# Patient Record
Sex: Female | Born: 1950 | Race: White | Hispanic: No | Marital: Married | Smoking: Never smoker
Health system: Southern US, Community
[De-identification: ages and names within clinical notes are randomized; demographics above are authoritative.]

## PROBLEM LIST (undated history)

## (undated) HISTORY — PX: LAPAROSCOPIC HYSTERECTOMY: SHX1926

---

## 1998-03-03 ENCOUNTER — Ambulatory Visit (HOSPITAL_COMMUNITY): Admission: RE | Admit: 1998-03-03 | Discharge: 1998-03-03 | Payer: Self-pay | Admitting: Emergency Medicine

## 1998-03-08 ENCOUNTER — Emergency Department (HOSPITAL_COMMUNITY): Admission: EM | Admit: 1998-03-08 | Discharge: 1998-03-08 | Payer: Self-pay | Admitting: Emergency Medicine

## 1998-03-10 ENCOUNTER — Emergency Department (HOSPITAL_COMMUNITY): Admission: EM | Admit: 1998-03-10 | Discharge: 1998-03-10 | Payer: Self-pay | Admitting: Emergency Medicine

## 1999-10-25 ENCOUNTER — Encounter: Payer: Self-pay | Admitting: Orthopedic Surgery

## 1999-10-25 ENCOUNTER — Ambulatory Visit (HOSPITAL_COMMUNITY): Admission: RE | Admit: 1999-10-25 | Discharge: 1999-10-25 | Payer: Self-pay | Admitting: Orthopedic Surgery

## 2000-10-29 ENCOUNTER — Emergency Department (HOSPITAL_COMMUNITY): Admission: EM | Admit: 2000-10-29 | Discharge: 2000-10-30 | Payer: Self-pay | Admitting: Emergency Medicine

## 2001-03-12 ENCOUNTER — Ambulatory Visit (HOSPITAL_COMMUNITY): Admission: RE | Admit: 2001-03-12 | Discharge: 2001-03-12 | Payer: Self-pay | Admitting: Emergency Medicine

## 2001-04-21 ENCOUNTER — Emergency Department (HOSPITAL_COMMUNITY): Admission: EM | Admit: 2001-04-21 | Discharge: 2001-04-22 | Payer: Self-pay | Admitting: Emergency Medicine

## 2001-04-22 ENCOUNTER — Encounter: Payer: Self-pay | Admitting: Emergency Medicine

## 2002-08-27 ENCOUNTER — Ambulatory Visit (HOSPITAL_COMMUNITY): Admission: RE | Admit: 2002-08-27 | Discharge: 2002-08-27 | Payer: Self-pay | Admitting: Gastroenterology

## 2002-08-27 ENCOUNTER — Encounter (INDEPENDENT_AMBULATORY_CARE_PROVIDER_SITE_OTHER): Payer: Self-pay | Admitting: Specialist

## 2002-08-28 ENCOUNTER — Ambulatory Visit (HOSPITAL_COMMUNITY): Admission: RE | Admit: 2002-08-28 | Discharge: 2002-08-28 | Payer: Self-pay | Admitting: Family Medicine

## 2002-08-28 ENCOUNTER — Encounter: Payer: Self-pay | Admitting: Family Medicine

## 2002-12-16 ENCOUNTER — Emergency Department (HOSPITAL_COMMUNITY): Admission: EM | Admit: 2002-12-16 | Discharge: 2002-12-16 | Payer: Self-pay | Admitting: Emergency Medicine

## 2003-03-31 ENCOUNTER — Encounter: Payer: Self-pay | Admitting: Obstetrics and Gynecology

## 2003-03-31 ENCOUNTER — Encounter: Admission: RE | Admit: 2003-03-31 | Discharge: 2003-03-31 | Payer: Self-pay | Admitting: Obstetrics and Gynecology

## 2003-09-20 ENCOUNTER — Emergency Department (HOSPITAL_COMMUNITY): Admission: EM | Admit: 2003-09-20 | Discharge: 2003-09-20 | Payer: Self-pay | Admitting: Emergency Medicine

## 2004-02-19 ENCOUNTER — Encounter (INDEPENDENT_AMBULATORY_CARE_PROVIDER_SITE_OTHER): Payer: Self-pay | Admitting: Specialist

## 2004-02-19 ENCOUNTER — Observation Stay (HOSPITAL_COMMUNITY): Admission: RE | Admit: 2004-02-19 | Discharge: 2004-02-20 | Payer: Self-pay | Admitting: Surgery

## 2004-11-04 ENCOUNTER — Other Ambulatory Visit: Admission: RE | Admit: 2004-11-04 | Discharge: 2004-11-04 | Payer: Self-pay | Admitting: Family Medicine

## 2004-11-08 ENCOUNTER — Ambulatory Visit (HOSPITAL_COMMUNITY): Admission: RE | Admit: 2004-11-08 | Discharge: 2004-11-08 | Payer: Self-pay | Admitting: Family Medicine

## 2004-12-23 ENCOUNTER — Ambulatory Visit (HOSPITAL_COMMUNITY): Admission: RE | Admit: 2004-12-23 | Discharge: 2004-12-23 | Payer: Self-pay | Admitting: Obstetrics and Gynecology

## 2005-05-05 IMAGING — US US PELVIS COMPLETE MODIFY
1 series · 14 of 25 positions shown · non-contrast
Comparison: none

CLINICAL DATA: Dysfunctional uterine bleeding.
 PELVIC ULTRASOUND ENDOVAGINAL AND TRANSABDOMINAL ? 11/08/04 AT 2356 HOURS:

[Series 1: unknown · 0.29mm/px · 14 of 59 slices shown]
[im 1/59]
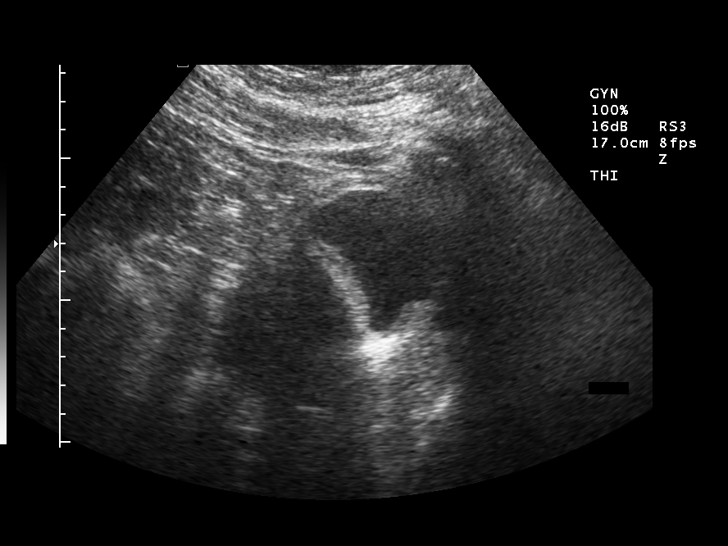
[im 5/59]
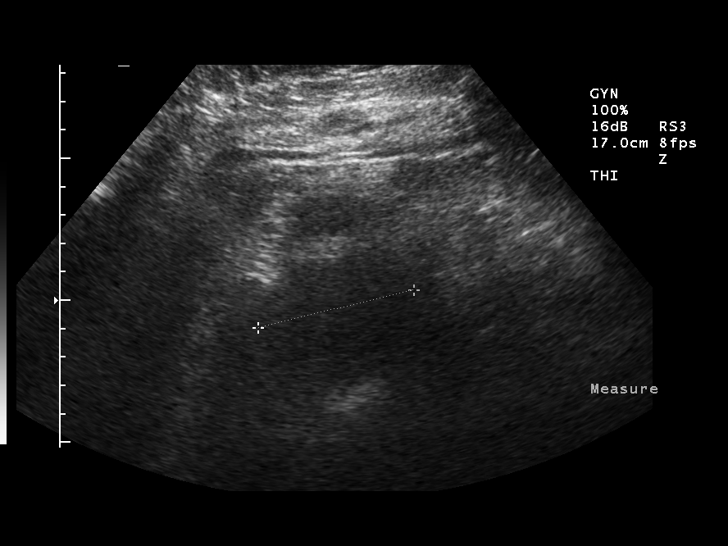
[im 10/59]
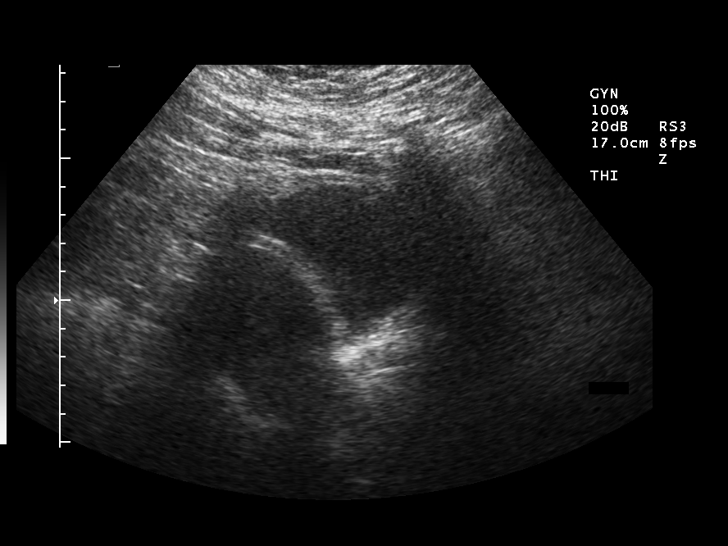
[im 15/59]
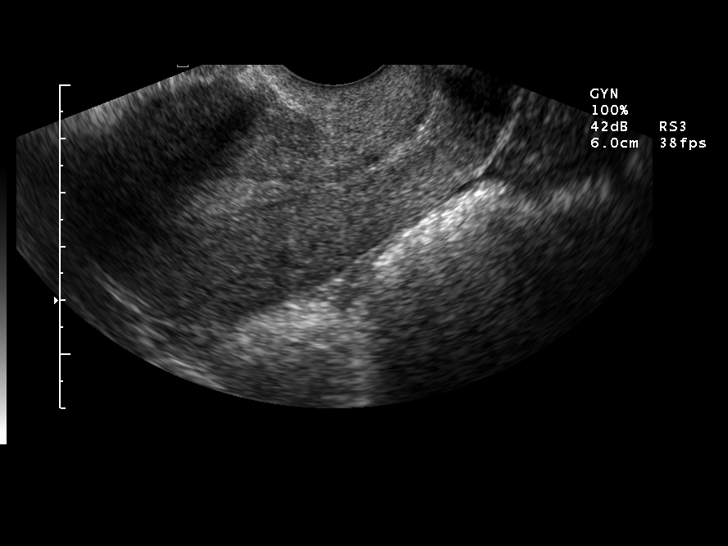
[im 20/59]
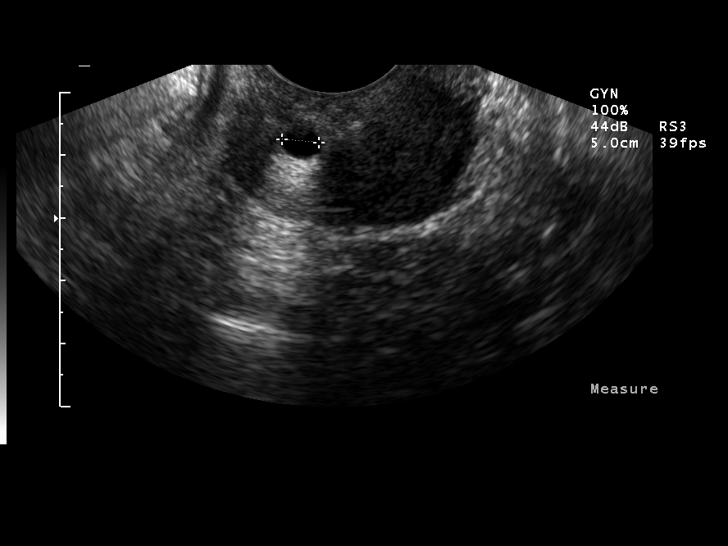
[im 22/59]
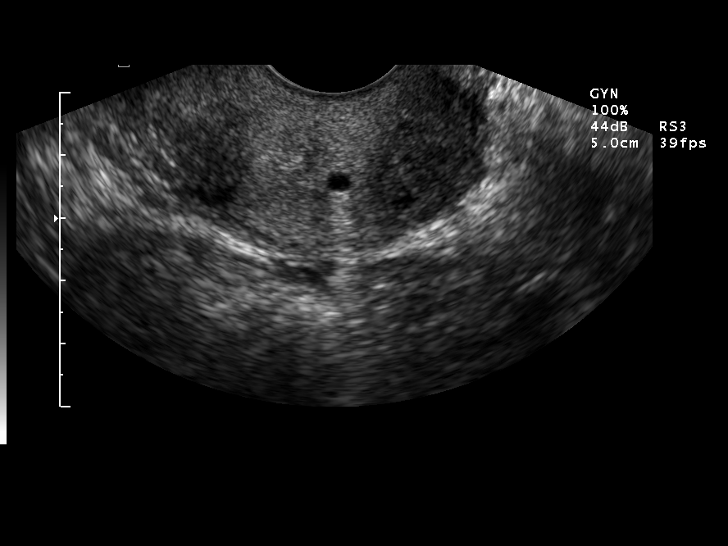
[im 27/59]
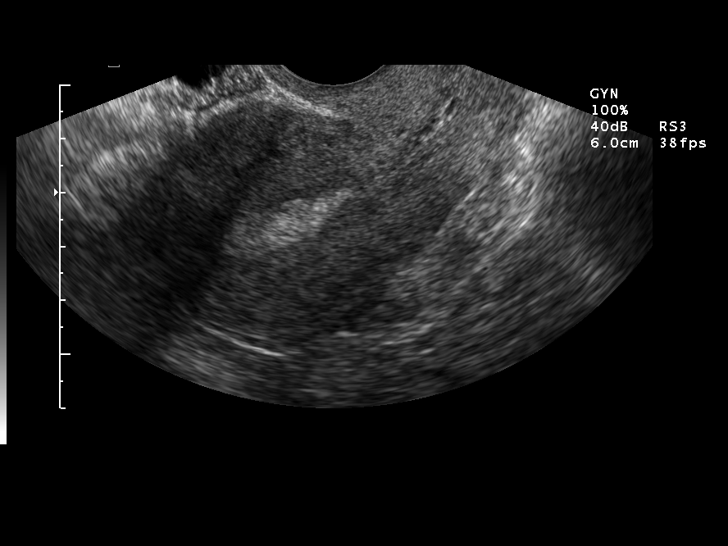
[im 32/59]
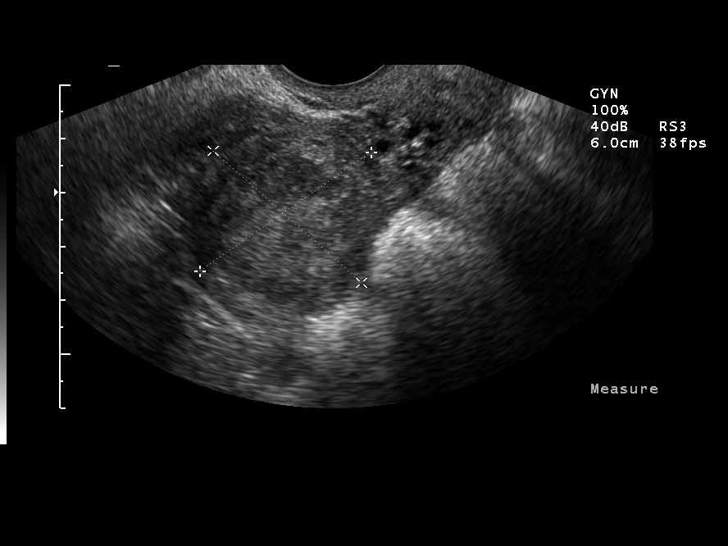
[im 37/59]
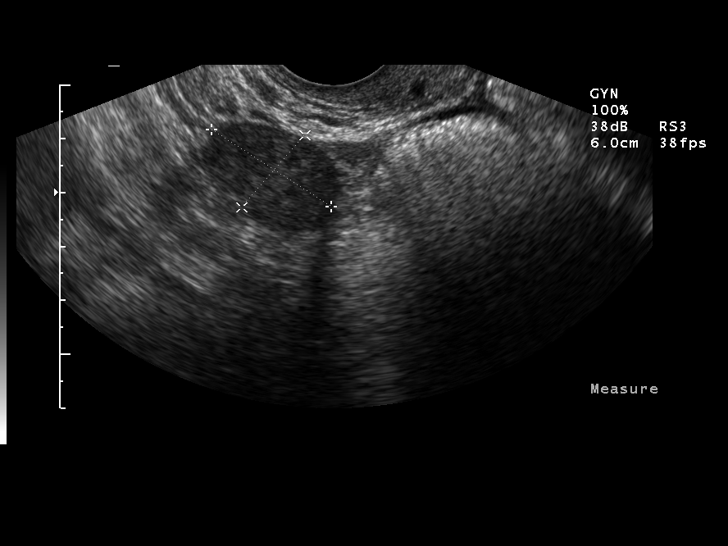
[im 39/59]
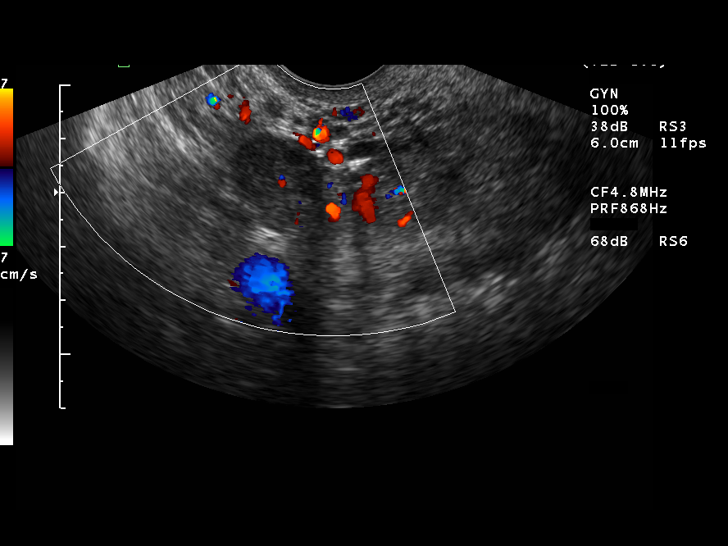
[im 44/59]
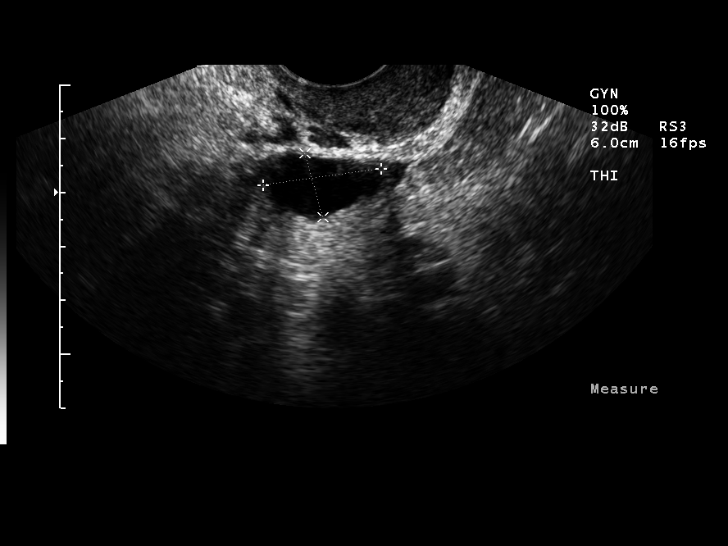
[im 49/59]
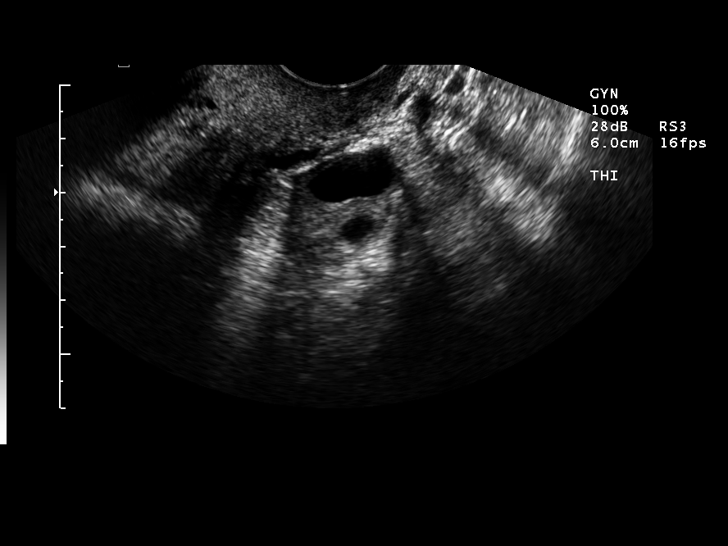
[im 54/59]
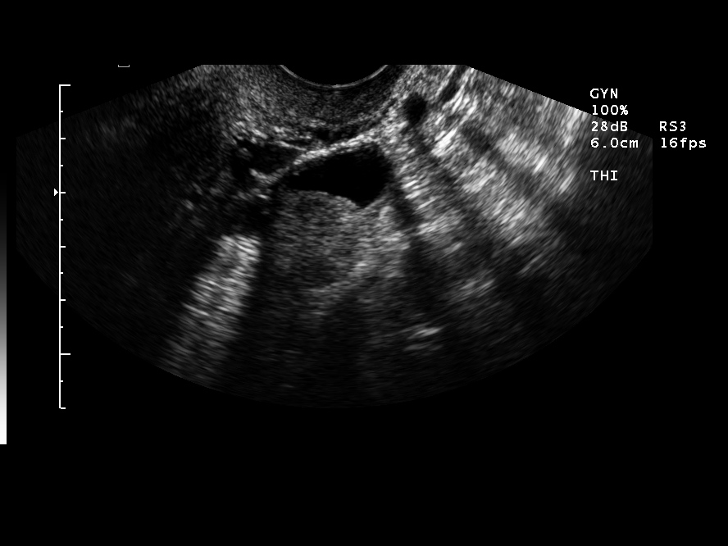
[im 59/59]
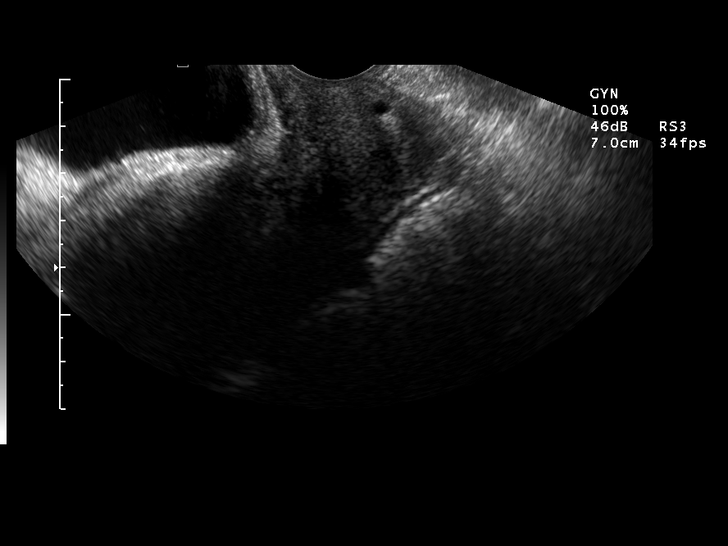

[14 of 25 positions shown; findings below may reference images not displayed]

FINDINGS: The endometrial stripe is uniform and is 9 mm in thickness.  A 3.8 x 3.6 x 3.9 cm fundal uterine fibroid is noted at the right side.  The right ovary is within normal limits.  There is a 2.2 x 1.2 x 1.9 cm complex cyst in the left ovary.  Negative free fluid.
IMPRESSION: 1.  Single large uterine fibroid.
 2.  Complex cyst in the left ovary.  Follow-up in six weeks is recommended to ensure resolution.

## 2005-06-19 IMAGING — US US PELVIS COMPLETE MODIFY
1 series · 14 of 25 positions shown · non-contrast
Comparison: none

CLINICAL DATA: The patient has a previous history of a complex left ovarian cyst. 
 ULTRASOUND OF THE PELVIS:
 Transabdominal study reveals the uterus to measure 8.8 x 4.2 x 4.6 cm.  A 2.9 x 3.2 x 2.8 cm fibroid is again noted in the right portion of the uterus.
 Transvaginal study reveals the endometrium again to be prominent measuring up to 9 mm.  
 The right ovary measures 4.4 x 2.8 x 4.0 cm with a 2.6 x 2.3 x 2.7 cm cyst in the right ovary.  There is noted to be a smaller cyst within this larger cyst. 
 The left ovary measures 3.2 x 1.6 x 2.1 cm. The previously noted 2.2 cm cyst of the left ovary is now smaller measuring 1.1 x .6 x 1.3 cm.  No free fluid.  Uterine fibroids are again noted.  There has been interval decrease in the previously noted 2.2 cm left ovarian cyst. The cyst is again irregular in contour but is smaller in size.   A new finding is a 2.7 cm cyst of the right ovary with a smaller internal cyst. 
 Follow-up ultrasound would be suggested in 2-3 months.

[Series 1: unknown · 0.31mm/px · 14 of 59 slices shown]
[im 1/59]
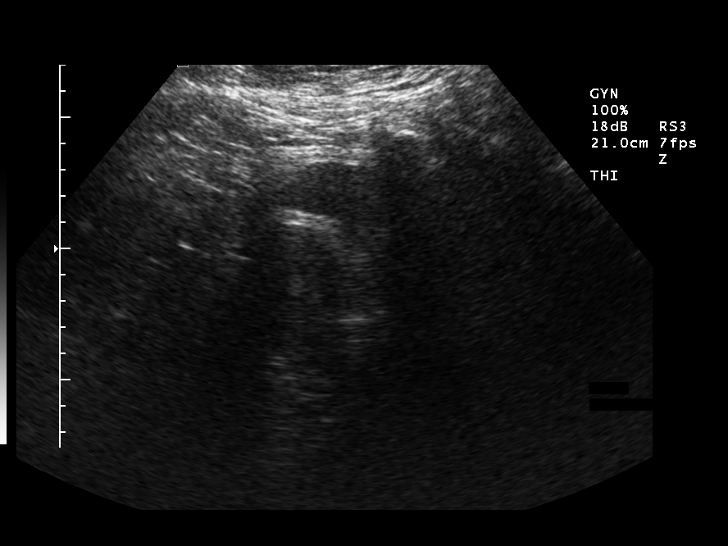
[im 5/59]
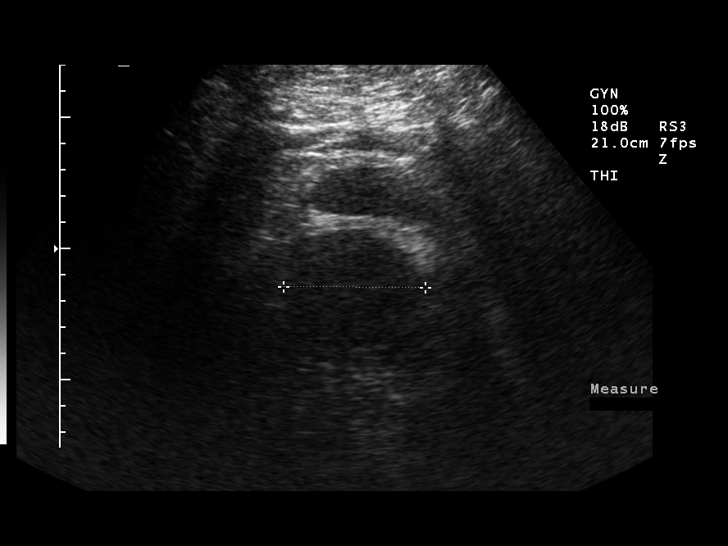
[im 10/59]
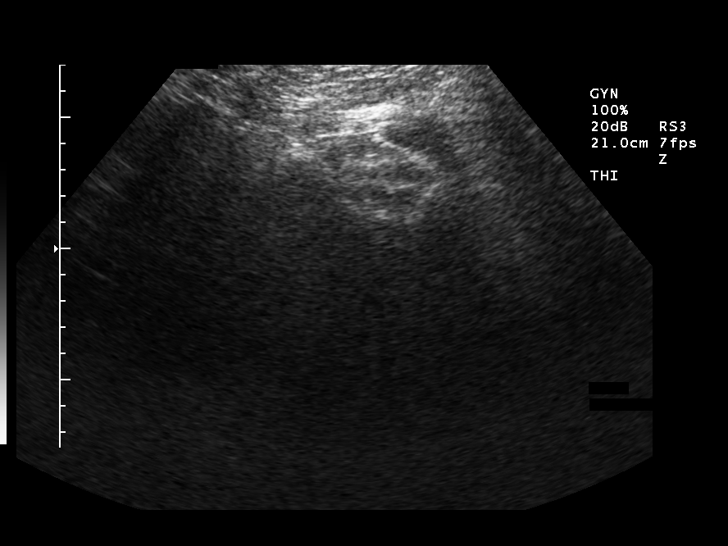
[im 15/59]
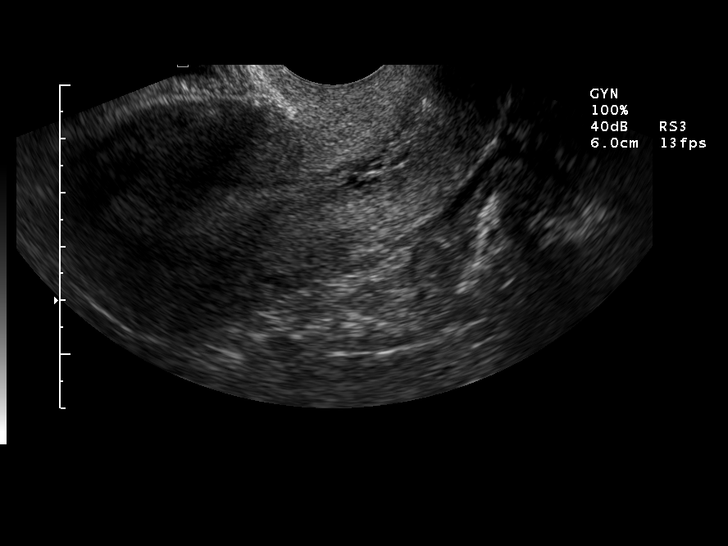
[im 20/59]
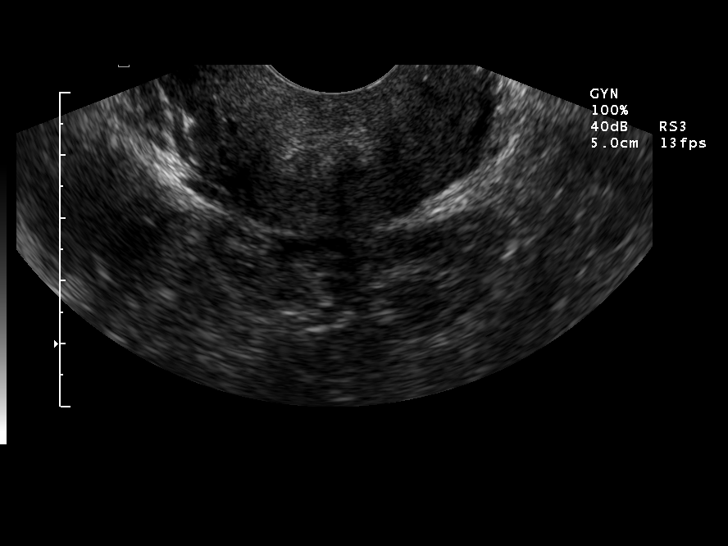
[im 22/59]
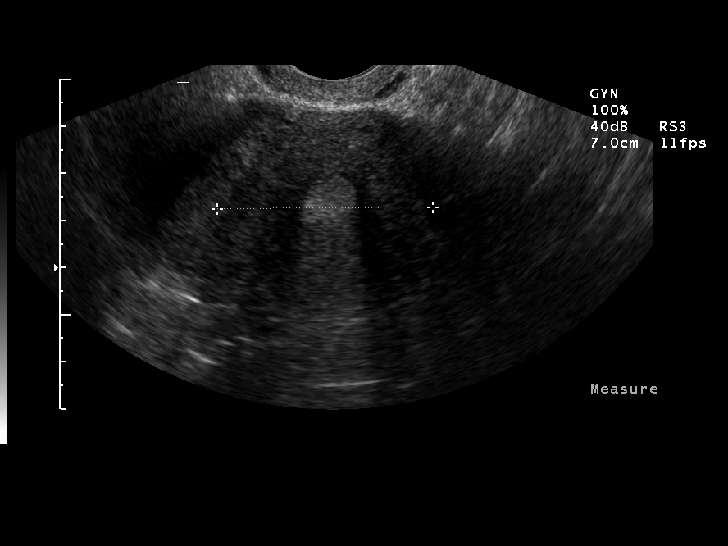
[im 27/59]
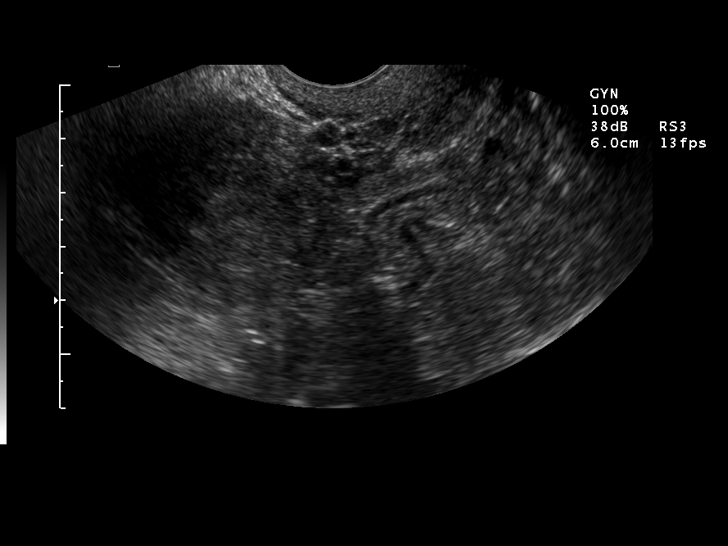
[im 32/59]
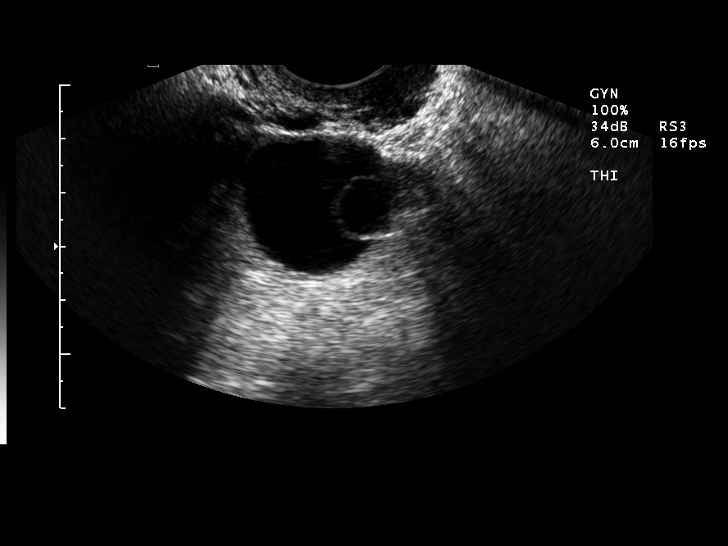
[im 37/59]
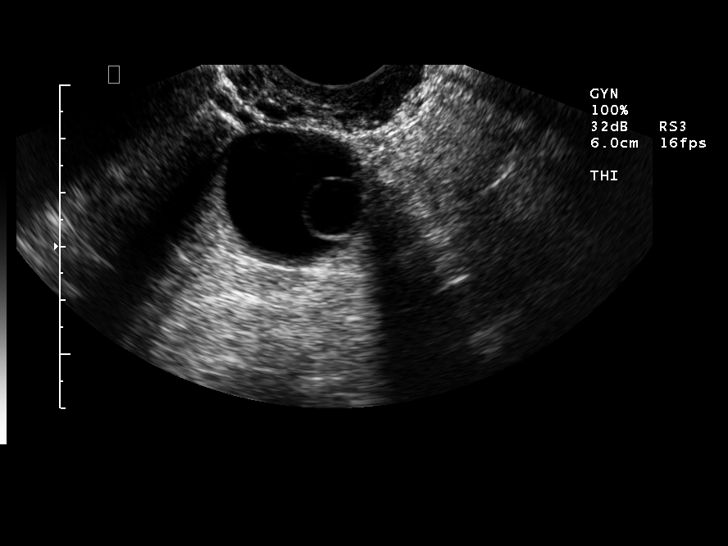
[im 39/59]
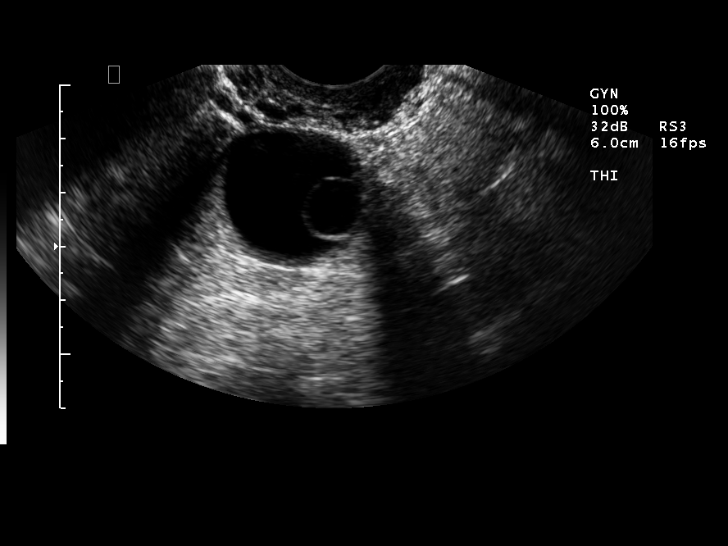
[im 44/59]
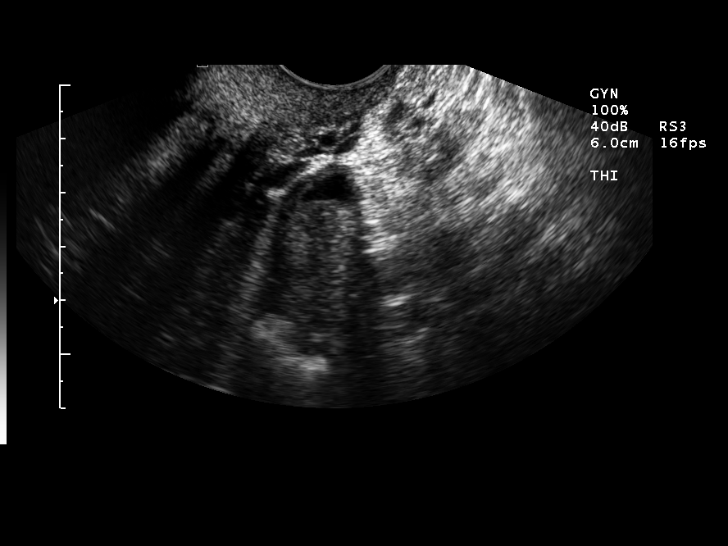
[im 49/59]
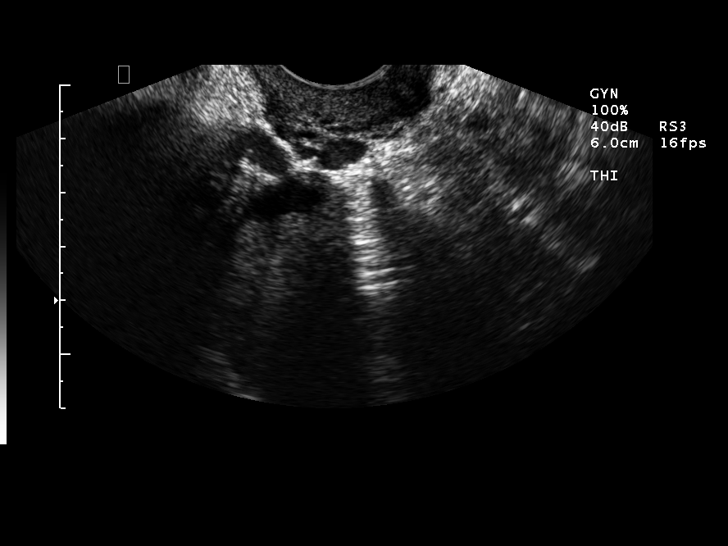
[im 54/59]
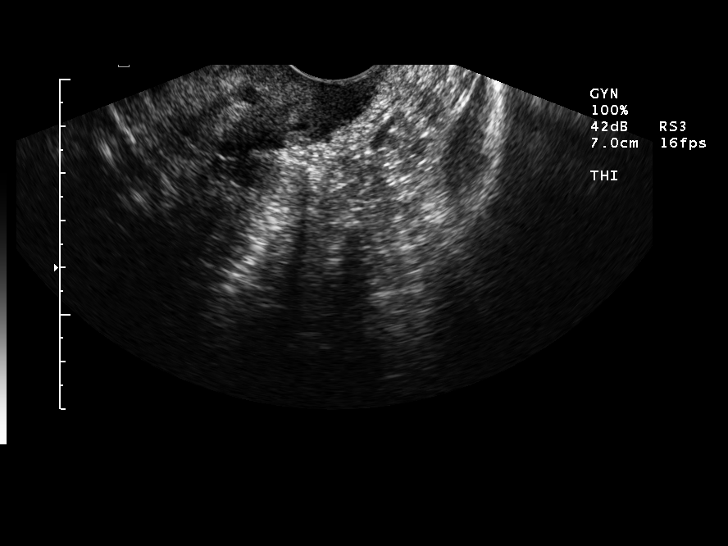
[im 59/59]
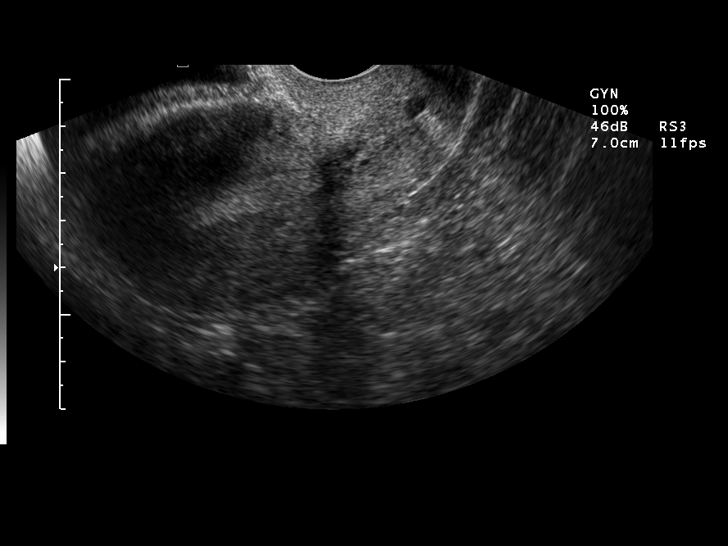

[14 of 25 positions shown; findings below may reference images not displayed]

IMPRESSION: As above.

## 2006-02-06 ENCOUNTER — Ambulatory Visit (HOSPITAL_COMMUNITY): Admission: RE | Admit: 2006-02-06 | Discharge: 2006-02-06 | Payer: Self-pay | Admitting: Family Medicine

## 2007-04-26 ENCOUNTER — Ambulatory Visit (HOSPITAL_COMMUNITY): Admission: RE | Admit: 2007-04-26 | Discharge: 2007-04-26 | Payer: Self-pay | Admitting: Obstetrics and Gynecology

## 2011-02-18 NOTE — Op Note (Signed)
NAME:  Karina Robbins, Karina Robbins                           ACCOUNT NO.:  [id number]   MEDICAL RECORD NO.:  [id number]                   PATIENT TYPE:  AMB   LOCATION:  ENDO                                 FACILITY[facility]  [facility]   PHYSICIAN:  Petra Kuba, M.D.                 DATE OF BIRTH:  08/08/1951   DATE OF PROCEDURE:  08/27/2002  DATE OF DISCHARGE:                                 OPERATIVE REPORT   PROCEDURE:  Colonoscopy with polypectomy.   INDICATION:  Family history of both colon cancer and colon polyps, due for  screening.  Consent was signed after risks, benefits, methods, options  thoroughly discussed in the office.   MEDICINES USED:  Demerol 60, Versed 5.   DESCRIPTION OF PROCEDURE:  Rectal inspection was pertinent for external  hemorrhoids, small.  Digital exam was negative.  The video pediatric  adjustable colonoscope was inserted, easily advanced around the colon to the  cecum.  This did not require any abdominal pressure or any position changes.  No obvious abnormality was seen on insertion.  The scope was inserted a  short ways into the terminal ileum which was normal.  Photodocumentation was  obtained.  The scope was slowly withdrawn.  In the cecal pole a tiny polyp  was seen and was cold biopsied x 1.  The scope was slowly withdrawn.  In the  distal ascending, another tiny polyp was seen and was hot biopsied x 2 and  put in the same container.  The scope was slowly withdrawn.  In the mid  transverse, a small polyp was seen, snared, electrocautery applied, and  suctioned through the scope and collected in the trap.  Another tiny polyp  near this in the mid transverse was seen and hot biopsied.  Those two  transverse polyps were put in the second container.  The scope was further  withdrawn.  No additional findings were seen as we slowly withdrew back to  the rectum.  However, on retroflexed view of the rectum, some tiny internal  hemorrhoids were seen and also a small polyp on  retroflexion which was  snared, electrocautery applied.  The polyp was removed, suctioned through  the scope, and collected in the trap and put in a third container.  The  polypectomy site was seen without any obvious residual polypoid tissue.  The  scope was straightened and readvanced a short ways up the left side of the  colon; air was suctioned and scope removed.  The prep was adequate with some  liquid stool that required washing and suctioning, not mentioned above.  Air  was suctioned, the scope slowly withdrawn.  The patient tolerated the  procedure well.  There was no obvious immediate complication.   ENDOSCOPIC DIAGNOSES:  1. Internal-external hemorrhoids.  2. Rectal polyp, small, status post snare on retroflexion.  3. Small and tiny transverse polyps, one snared, one hot biopsied.  4. Ascending tiny polyp, hot biopsied.  5. Cecal tiny polyp, cold biopsied.  6. Otherwise, within normal limits to the terminal ileum.   PLAN:  1. Await pathology to determine future colonic screening.  2.     Happy to see back p.r.n.  3. Otherwise return care to Karina Robbins, M.D. for the customary     health care maintenance to include yearly rectals and guaiacs.                                               Petra Kuba, M.D.    MEM/MEDQ  D:  08/27/2002  T:  08/27/2002  Job:  [id number]   cc:   Karina Robbins, M.D.  [address]  [place]  [place] [address]  Fax: [phone]

## 2011-02-18 NOTE — Op Note (Signed)
NAME:  Karina Robbins, Karina Robbins                     ACCOUNT NO.:  [id number]   MEDICAL RECORD NO.:  [id number]                   PATIENT TYPE:  AMB   LOCATION:  DAY                                  FACILITY:  [facility]   PHYSICIAN:  Velora Heckler, M.D.                DATE OF BIRTH:  07/14/1951   DATE OF PROCEDURE:  02/19/2004  DATE OF DISCHARGE:                                 OPERATIVE REPORT   PREOPERATIVE DIAGNOSES:  Cholelithiasis, chronic cholecystitis.   POSTOPERATIVE DIAGNOSES:  Cholelithiasis, chronic cholecystitis.   PROCEDURE:  Laparoscopic cholecystectomy with intraoperative  cholangiography.   SURGEON:  Velora Heckler, M.D.   ASSISTANT:  Leonie Man, M.D.   ANESTHESIA:  General.   ESTIMATED BLOOD LOSS:  Minimal.   PREPARATION:  Betadine.   COMPLICATIONS:  None.   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  The patient had an episode of biliary  colic in March 2005 in [place], [place]. She was seen at  [facility].  Laboratory studies showed abnormal liver  function tests.  Ultrasound demonstrated multiple gallstones.  The patient  returned to [facility] and repeat liver function tests returned to normal  levels.  The patient now comes to surgery for cholecystectomy.   DESCRIPTION OF PROCEDURE:  The was done in OR #11 at the [facility].  The patient was brought to the operating room, placed  in a supine position on the operating room table. Following the  administration of general anesthesia, the patient was prepped and draped in  the usual strict aseptic fashion. After ascertaining that an adequate level  of anesthesia had been obtained, an infraumbilical incision was made with a  #15 blade.  Dissection was carried down through the subcutaneous tissues.  The fascia was incised in  the midline, the peritoneal cavity is entered  cautiously. A #0 Vicryl pursestring suture is placed in the fascia. A Hasson  cannula is introduced and secured with a pursestring suture.  The abdomen is  insufflated with carbon dioxide.  The laparoscope is introduced and the  abdomen is explored. The operative ports are placed along the right costal  margin in the midline, mid clavicular line and the anterior axillary line.  The fundus of the gallbladder is grasped and retracted cephalad.  The  gallbladder is markedly thick walled. It appears contracted.  There is some  edema. Dissection is begun at the neck of the gallbladder. The cystic duct  is dissected out along its length. There is a stone impacted in the cystic  duct which is milked retrograde back into the gallbladder. A clip is placed  at the neck of the gallbladder. The cystic duct is incised.  Yellow bile  emanates from the cystic duct. A Cook cholangiography catheter is introduced  through a stab wound  in the right upper quadrant and inserted into the  cystic duct. It is secured with a Ligaclip. Real-time cholangiography is  performed with the C-arm.  There is rapid filling of the common bile duct  with free flow distally into the duodenum without evidence of obstruction or  filling defect. There is reflux of contrast into the right and left hepatic  ductal systems.  Clip is removed and the [facility] catheter is removed from the  peritoneal cavity.  The cystic duct is triply clipped and divided.  The  posterior branches of the cystic artery are divided between Ligaclips. The  anterior branch of the cystic artery is divided between Ligaclips. The  gallbladder is then excised from the gallbladder bed using the hook and  spatula electrocauteries for hemostasis. There is some spillage of bile and  small stone debris.  This is a thick brownish green colored fluid. The stone  fragments are retrieved. The right upper quadrant is irrigated  copiously  with warm saline. Bleeding in the gallbladder bed is controlled with the  electrocautery.  The gallbladder is completely excised and placed into an  EndoCatch bag. It is withdrawn from the peritoneal cavity through the  umbilical port. The #0 Vicryl pursestring suture is tied securely.  The  right upper quadrant is again irrigated with warm saline which is evacuated.  Fluid is evacuated from the pelvis. Ports are removed under direct vision  and there is good hemostasis noted at all port sites. Pneumoperitoneum is  released. All wounds are anesthetized with local anesthetic. All wounds are  closed with interrupted 4-0 Vicryl subcuticular sutures. The wounds are  washed and dried and Benzoin and Steri-Strips are applied. Sterile gauze  dressings are applied.  The patient was awakened from anesthesia and brought  to the recovery room in stable condition. The patient tolerated the  procedure well.                                               Velora Heckler, M.D.    TMG/MEDQ  D:  02/19/2004  T:  02/19/2004  Job:  [id number]   cc:   Talmadge Coventry, M.D.  [address]  [place]  [place] [address]  Fax: [phone]

## 2019-12-10 DIAGNOSIS — M545 Low back pain: Secondary | ICD-10-CM | POA: Diagnosis not present

## 2019-12-10 DIAGNOSIS — E7800 Pure hypercholesterolemia, unspecified: Secondary | ICD-10-CM | POA: Diagnosis not present

## 2019-12-10 DIAGNOSIS — E119 Type 2 diabetes mellitus without complications: Secondary | ICD-10-CM | POA: Diagnosis not present

## 2019-12-10 DIAGNOSIS — F419 Anxiety disorder, unspecified: Secondary | ICD-10-CM | POA: Diagnosis not present

## 2020-04-15 DIAGNOSIS — E119 Type 2 diabetes mellitus without complications: Secondary | ICD-10-CM | POA: Diagnosis not present

## 2020-04-15 DIAGNOSIS — H2513 Age-related nuclear cataract, bilateral: Secondary | ICD-10-CM | POA: Diagnosis not present

## 2020-04-15 DIAGNOSIS — H18513 Endothelial corneal dystrophy, bilateral: Secondary | ICD-10-CM | POA: Diagnosis not present

## 2020-05-15 DIAGNOSIS — H25811 Combined forms of age-related cataract, right eye: Secondary | ICD-10-CM | POA: Diagnosis not present

## 2020-05-15 DIAGNOSIS — H52221 Regular astigmatism, right eye: Secondary | ICD-10-CM | POA: Diagnosis not present

## 2020-06-05 DIAGNOSIS — H25812 Combined forms of age-related cataract, left eye: Secondary | ICD-10-CM | POA: Diagnosis not present

## 2020-07-14 DIAGNOSIS — Z09 Encounter for follow-up examination after completed treatment for conditions other than malignant neoplasm: Secondary | ICD-10-CM | POA: Diagnosis not present

## 2020-07-14 DIAGNOSIS — Z86010 Personal history of colonic polyps: Secondary | ICD-10-CM | POA: Diagnosis not present

## 2020-07-14 DIAGNOSIS — Z800 Family history of malignant neoplasm of digestive organs: Secondary | ICD-10-CM | POA: Diagnosis not present

## 2020-07-14 DIAGNOSIS — K219 Gastro-esophageal reflux disease without esophagitis: Secondary | ICD-10-CM | POA: Diagnosis not present

## 2020-07-20 DIAGNOSIS — L989 Disorder of the skin and subcutaneous tissue, unspecified: Secondary | ICD-10-CM | POA: Diagnosis not present

## 2020-07-20 DIAGNOSIS — E7800 Pure hypercholesterolemia, unspecified: Secondary | ICD-10-CM | POA: Diagnosis not present

## 2020-07-20 DIAGNOSIS — Z23 Encounter for immunization: Secondary | ICD-10-CM | POA: Diagnosis not present

## 2020-07-20 DIAGNOSIS — Z0189 Encounter for other specified special examinations: Secondary | ICD-10-CM | POA: Diagnosis not present

## 2020-07-20 DIAGNOSIS — M545 Low back pain, unspecified: Secondary | ICD-10-CM | POA: Diagnosis not present

## 2020-07-20 DIAGNOSIS — E119 Type 2 diabetes mellitus without complications: Secondary | ICD-10-CM | POA: Diagnosis not present

## 2020-08-05 DIAGNOSIS — Z461 Encounter for fitting and adjustment of hearing aid: Secondary | ICD-10-CM | POA: Diagnosis not present

## 2020-08-05 DIAGNOSIS — H903 Sensorineural hearing loss, bilateral: Secondary | ICD-10-CM | POA: Diagnosis not present

## 2020-08-12 ENCOUNTER — Encounter: Payer: Self-pay | Admitting: Gastroenterology

## 2020-10-12 ENCOUNTER — Encounter: Payer: Self-pay | Admitting: Gastroenterology

## 2022-06-27 ENCOUNTER — Other Ambulatory Visit (HOSPITAL_COMMUNITY): Payer: Self-pay | Admitting: "Endocrinology

## 2022-06-27 ENCOUNTER — Other Ambulatory Visit: Payer: Self-pay | Admitting: "Endocrinology

## 2022-06-27 DIAGNOSIS — E041 Nontoxic single thyroid nodule: Secondary | ICD-10-CM

## 2022-07-04 ENCOUNTER — Other Ambulatory Visit (HOSPITAL_COMMUNITY): Payer: Self-pay | Admitting: "Endocrinology

## 2022-07-04 ENCOUNTER — Ambulatory Visit
Admission: RE | Admit: 2022-07-04 | Discharge: 2022-07-04 | Disposition: A | Discharge status: Home / Self Care | Payer: Self-pay | Source: Ambulatory Visit | Attending: "Endocrinology | Admitting: "Endocrinology

## 2022-07-04 DIAGNOSIS — E041 Nontoxic single thyroid nodule: Secondary | ICD-10-CM

## 2022-07-05 ENCOUNTER — Other Ambulatory Visit: Payer: Self-pay | Admitting: Medical

## 2022-07-06 NOTE — Progress Notes (Unsigned)
Arne Cleveland, MD  Donita Brooks D Ok   [place] FNA 1.8cm mid R TR4 and 3.1cm mid L TR4   DDH

## 2022-07-12 ENCOUNTER — Other Ambulatory Visit (HOSPITAL_COMMUNITY): Payer: Self-pay | Admitting: "Endocrinology

## 2022-07-12 DIAGNOSIS — E041 Nontoxic single thyroid nodule: Secondary | ICD-10-CM

## 2022-07-13 ENCOUNTER — Ambulatory Visit (HOSPITAL_COMMUNITY)
Admission: RE | Admit: 2022-07-13 | Discharge: 2022-07-13 | Disposition: A | Discharge status: Home / Self Care | Payer: No Typology Code available for payment source | Source: Ambulatory Visit | Attending: "Endocrinology | Admitting: "Endocrinology

## 2022-07-13 ENCOUNTER — Encounter (HOSPITAL_COMMUNITY): Payer: Self-pay

## 2022-07-13 DIAGNOSIS — E041 Nontoxic single thyroid nodule: Secondary | ICD-10-CM | POA: Diagnosis present

## 2022-07-13 DIAGNOSIS — E042 Nontoxic multinodular goiter: Secondary | ICD-10-CM | POA: Diagnosis not present

## 2022-07-13 MED ORDER — LIDOCAINE HCL (PF) 2 % IJ SOLN
INTRAMUSCULAR | Status: AC
  Administered 2022-07-13: 10 mL
  Filled 2022-07-13: qty 20

## 2022-07-13 NOTE — Progress Notes (Signed)
PT tolerated thyroid biopsy procedure well today. Labs and afirma obtained and sent for pathology. PT ambulatory at discharge with no acute distress noted and verbalized understanding of discharge instructions. 

## 2022-07-14 LAB — CYTOLOGY - NON PAP
